# Patient Record
Sex: Female | Born: 1975 | Race: Black or African American | Hispanic: No | Marital: Married | Smoking: Never smoker
Health system: Southern US, Community
[De-identification: ages and names within clinical notes are randomized; demographics above are authoritative.]

---

## 2015-05-21 ENCOUNTER — Encounter: Payer: Self-pay | Admitting: Emergency Medicine

## 2015-05-21 ENCOUNTER — Emergency Department: Payer: Self-pay

## 2015-05-21 ENCOUNTER — Emergency Department
Admission: EM | Admit: 2015-05-21 | Discharge: 2015-05-21 | Disposition: A | Discharge status: Home / Self Care | Payer: Self-pay | Attending: Emergency Medicine | Admitting: Emergency Medicine

## 2015-05-21 DIAGNOSIS — R0602 Shortness of breath: Secondary | ICD-10-CM | POA: Insufficient documentation

## 2015-05-21 DIAGNOSIS — R002 Palpitations: Secondary | ICD-10-CM | POA: Insufficient documentation

## 2015-05-21 DIAGNOSIS — R0789 Other chest pain: Secondary | ICD-10-CM | POA: Insufficient documentation

## 2015-05-21 LAB — COMPREHENSIVE METABOLIC PANEL
ALBUMIN: 4.3 g/dL (ref 3.5–5.0)
ALK PHOS: 64 U/L (ref 38–126)
ALT: 15 U/L (ref 14–54)
AST: 23 U/L (ref 15–41)
Anion gap: 7 (ref 5–15)
BUN: 9 mg/dL (ref 6–20)
CALCIUM: 8.9 mg/dL (ref 8.9–10.3)
CO2: 24 mmol/L (ref 22–32)
CREATININE: 0.66 mg/dL (ref 0.44–1.00)
Chloride: 106 mmol/L (ref 101–111)
GFR calc non Af Amer: >60 mL/min (ref >60)
GLUCOSE: 112 mg/dL — AB (ref 65–99)
Potassium: 3.5 mmol/L (ref 3.5–5.1)
SODIUM: 137 mmol/L (ref 135–145)
Total Bilirubin: 0.3 mg/dL (ref 0.3–1.2)
Total Protein: 7.5 g/dL (ref 6.5–8.1)

## 2015-05-21 LAB — TROPONIN I: Troponin I: <0.03 ng/mL (ref <0.031)

## 2015-05-21 LAB — CBC WITH DIFFERENTIAL/PLATELET
BASOS PCT: 1 %
Basophils Absolute: 0 10*3/uL (ref 0–0.1)
Eosinophils Absolute: 0.1 10*3/uL (ref 0–0.7)
Eosinophils Relative: 1 %
HEMATOCRIT: 39.3 % (ref 35.0–47.0)
HEMOGLOBIN: 12.9 g/dL (ref 12.0–16.0)
Lymphocytes Relative: 23 %
Lymphs Abs: 1.3 10*3/uL (ref 1.0–3.6)
MCH: 26.9 pg (ref 26.0–34.0)
MCHC: 32.9 g/dL (ref 32.0–36.0)
MCV: 81.8 fL (ref 80.0–100.0)
MONOS PCT: 8 %
Monocytes Absolute: 0.4 10*3/uL (ref 0.2–0.9)
NEUTROS ABS: 3.9 10*3/uL (ref 1.4–6.5)
NEUTROS PCT: 67 %
Platelets: 283 10*3/uL (ref 150–440)
RBC: 4.8 MIL/uL (ref 3.80–5.20)
RDW: 13.7 % (ref 11.5–14.5)
WBC: 5.8 10*3/uL (ref 3.6–11.0)

## 2015-05-21 LAB — FIBRIN DERIVATIVES D-DIMER (ARMC ONLY): Fibrin derivatives D-dimer (ARMC): 207 (ref 0–499)

## 2015-05-21 NOTE — Discharge Instructions (Signed)
You have been seen in the Emergency Department (ED) today for chest pain.  As we have discussed todays test results are normal, and we suspect that you are having her symptoms as a result of the extra stress and strain your body has been under.  We provided the name and number of a clinic with whom he can follow up to establish a primary care doctor.  If you are not doing so already, please also take a daily baby aspirin (81 mg), at least until you follow up with your doctor.  Return to the Emergency Department (ED) if you experience any further chest pain/pressure/tightness, difficulty breathing, or sudden sweating, or other symptoms that concern you.   Chest Pain (Nonspecific) It is often hard to give a specific diagnosis for the cause of chest pain. There is always a chance that your pain could be related to something serious, such as a heart attack or a blood clot in the lungs. You need to follow up with your health care provider for further evaluation. CAUSES   Heartburn.  Pneumonia or bronchitis.  Anxiety or stress.  Inflammation around your heart (pericarditis) or lung (pleuritis or pleurisy).  A blood clot in the lung.  A collapsed lung (pneumothorax). It can develop suddenly on its own (spontaneous pneumothorax) or from trauma to the chest.  Shingles infection (herpes zoster virus). The chest wall is composed of bones, muscles, and cartilage. Any of these can be the source of the pain.  The bones can be bruised by injury.  The muscles or cartilage can be strained by coughing or overwork.  The cartilage can be affected by inflammation and become sore (costochondritis). DIAGNOSIS  Lab tests or other studies may be needed to find the cause of your pain. Your health care provider may have you take a test called an ambulatory electrocardiogram (ECG). An ECG records your heartbeat patterns over a 24-hour period. You may also have other tests, such as:  Transthoracic  echocardiogram (TTE). During echocardiography, sound waves are used to evaluate how blood flows through your heart.  Transesophageal echocardiogram (TEE).  Cardiac monitoring. This allows your health care provider to monitor your heart rate and rhythm in real time.  Holter monitor. This is a portable device that records your heartbeat and can help diagnose heart arrhythmias. It allows your health care provider to track your heart activity for several days, if needed.  Stress tests by exercise or by giving medicine that makes the heart beat faster. TREATMENT   Treatment depends on what may be causing your chest pain. Treatment may include:  Acid blockers for heartburn.  Anti-inflammatory medicine.  Pain medicine for inflammatory conditions.  Antibiotics if an infection is present.  You may be advised to change lifestyle habits. This includes stopping smoking and avoiding alcohol, caffeine, and chocolate.  You may be advised to keep your head raised (elevated) when sleeping. This reduces the chance of acid going backward from your stomach into your esophagus. Most of the time, nonspecific chest pain will improve within 2-3 days with rest and mild pain medicine.  HOME CARE INSTRUCTIONS   If antibiotics were prescribed, take them as directed. Finish them even if you start to feel better.  For the next few days, avoid physical activities that bring on chest pain. Continue physical activities as directed.  Do not use any tobacco products, including cigarettes, chewing tobacco, or electronic cigarettes.  Avoid drinking alcohol.  Only take medicine as directed by your health care provider.  Follow your health care provider's suggestions for further testing if your chest pain does not go away.  Keep any follow-up appointments you made. If you do not go to an appointment, you could develop lasting (chronic) problems with pain. If there is any problem keeping an appointment, call to  reschedule. SEEK MEDICAL CARE IF:   Your chest pain does not go away, even after treatment.  You have a rash with blisters on your chest.  You have a fever. SEEK IMMEDIATE MEDICAL CARE IF:   You have increased chest pain or pain that spreads to your arm, neck, jaw, back, or abdomen.  You have shortness of breath.  You have an increasing cough, or you cough up blood.  You have severe back or abdominal pain.  You feel nauseous or vomit.  You have severe weakness.  You faint.  You have chills. This is an emergency. Do not wait to see if the pain will go away. Get medical help at once. Call your local emergency services (911 in [place] Do not drive yourself to the hospital. MAKE SURE YOU:   Understand these instructions.  Will watch your condition.  Will get help right away if you are not doing well or get worse. Document Released: 03/25/2005 Document Revised: 06/20/2013 Document Reviewed: 01/19/2008 [facility] Patient Information 2015 [place], [place]. This information is not intended to replace advice given to you by your health care provider. Make sure you discuss any questions you have with your health care provider.    Palpitations A palpitation is the feeling that your heartbeat is irregular or is faster than normal. It may feel like your heart is fluttering or skipping a beat. Palpitations are usually not a serious problem. However, in some cases, you may need further medical evaluation. CAUSES  Palpitations can be caused by:  Smoking.  Caffeine or other stimulants, such as diet pills or energy drinks.  Alcohol.  Stress and anxiety.  Strenuous physical activity.  Fatigue.  Certain medicines.  Heart disease, especially if you have a history of irregular heart rhythms (arrhythmias), such as atrial fibrillation, atrial flutter, or supraventricular tachycardia.  An improperly working pacemaker or defibrillator. DIAGNOSIS  To find the cause of your palpitations,  your health care provider will take your medical history and perform a physical exam. Your health care provider may also have you take a test called an ambulatory electrocardiogram (ECG). An ECG records your heartbeat patterns over a 24-hour period. You may also have other tests, such as:  Transthoracic echocardiogram (TTE). During echocardiography, sound waves are used to evaluate how blood flows through your heart.  Transesophageal echocardiogram (TEE).  Cardiac monitoring. This allows your health care provider to monitor your heart rate and rhythm in real time.  Holter monitor. This is a portable device that records your heartbeat and can help diagnose heart arrhythmias. It allows your health care provider to track your heart activity for several days, if needed.  Stress tests by exercise or by giving medicine that makes the heart beat faster. TREATMENT  Treatment of palpitations depends on the cause of your symptoms and can vary greatly. Most cases of palpitations do not require any treatment other than time, relaxation, and monitoring your symptoms. Other causes, such as atrial fibrillation, atrial flutter, or supraventricular tachycardia, usually require further treatment. HOME CARE INSTRUCTIONS   Avoid:  Caffeinated coffee, tea, soft drinks, diet pills, and energy drinks.  Chocolate.  Alcohol.  Stop smoking if you smoke.  Reduce your stress and anxiety.  Things that can help you relax include:  A method of controlling things in your body, such as your heartbeats, with your mind (biofeedback).  Yoga.  Meditation.  Physical activity such as swimming, jogging, or walking.  Get plenty of rest and sleep. SEEK MEDICAL CARE IF:   You continue to have a fast or irregular heartbeat beyond 24 hours.  Your palpitations occur more often. SEEK IMMEDIATE MEDICAL CARE IF:  You have chest pain or shortness of breath.  You have a severe headache.  You feel dizzy or you  faint. MAKE SURE YOU:  Understand these instructions.  Will watch your condition.  Will get help right away if you are not doing well or get worse.   This information is not intended to replace advice given to you by your health care provider. Make sure you discuss any questions you have with your health care provider.   Document Released: 06/12/2000 Document Revised: 06/20/2013 Document Reviewed: 08/14/2011 Elsevier Interactive Patient Education [employer]2016 [employer] Inc.

## 2015-05-21 NOTE — ED Notes (Signed)
PT to ER with c/o palpitations, SHOB and chest heaviness.  Pt states has been fatigued lately due to family illness

## 2015-05-21 NOTE — ED Provider Notes (Signed)
[facility] Emergency Department Provider Note  ____________________________________________  Time seen: Approximately 6:34 PM  I have reviewed the triage vital signs and the nursing notes.   HISTORY  Chief Complaint Chest Pain and Palpitations    HPI Baldwin CrownBridget Delrossi is a 39 y.o. female who reports no past medical history and who does not smoke presents with palpitations and chest discomfort earlier today.  She states that the symptoms were relatively acute in onset and brief.  She reports some shortness of breath and the feeling of chest heaviness at the time.  It is completely resolved.  She states that she is dealing with a difficult situation with a family member who is in the hospital in another town and she has been very tired, not sleeping or eating well, going back and forth to check on the family member and deal with her own life as well.  She has had no infectious symptoms and denies fever/chills, abdominal pain, nausea/vomiting, diaphoresis, dysuria.  She has never had this kind of symptom before and describes it as mild but it was concerning to her at this time.  Though she has been traveling recently to see her relative, the driver was relatively short (within an hour) and she is active, ambulatory, not recently immobile, no history of cancer, no history of recent surgery, and does not take any exogenous estrogen.  She is also not had any swelling or pain in either of her legs.   History reviewed. No pertinent past medical history.  There are no active problems to display for this patient.   History reviewed. No pertinent past surgical history.  No current outpatient prescriptions on file.  Allergies Review of patient's allergies indicates no known allergies.  History reviewed. No pertinent family history.  Social History Social History  Substance Use Topics  . Smoking status: Never Smoker   . Smokeless tobacco: None  . Alcohol Use: No     Review of Systems Constitutional: No fever/chills Eyes: No visual changes. ENT: No sore throat. Cardiovascular: Brief feeling of palpitations and chest heaviness Respiratory: Mild shortness of breath associated with the palpitations Gastrointestinal: No abdominal pain.  No nausea, no vomiting.  No diarrhea.  No constipation. Genitourinary: Negative for dysuria. Musculoskeletal: Negative for back pain. Skin: Negative for rash. Neurological: Negative for headaches, focal weakness or numbness.  10-point ROS otherwise negative.  ____________________________________________   PHYSICAL EXAM:  VITAL SIGNS: ED Triage Vitals  Enc Vitals Group     BP 05/21/15 1727 138/76 mmHg     Pulse Rate 05/21/15 1727 108     Resp 05/21/15 1727 18     Temp 05/21/15 1727 98.2 F (36.8 C)     Temp src --      SpO2 05/21/15 1727 100 %     Weight 05/21/15 1727 145 lb (65.772 kg)     Height 05/21/15 1727 5\' 7"  (1.702 m)     Head Cir --      Peak Flow --      Pain Score 05/21/15 1728 0     Pain Loc --      Pain Edu? --      Excl. in GC? --     Constitutional: Alert and oriented. Well appearing and in no acute distress. Eyes: Conjunctivae are normal. PERRL. EOMI. Head: Atraumatic. Nose: No congestion/rhinnorhea. Mouth/Throat: Mucous membranes are moist.  Oropharynx non-erythematous. Neck: No stridor.   Cardiovascular: Normal rate, regular rhythm. Grossly normal heart sounds.  Good peripheral circulation.  When the patient first checked and she had mild tachycardia but it has completely resolved during my evaluation. Respiratory: Normal respiratory effort.  No retractions. Lungs CTAB. Gastrointestinal: Soft and nontender. No distention. No abdominal bruits. No CVA tenderness. Musculoskeletal: No lower extremity tenderness nor edema.  No joint effusions. Neurologic:  Normal speech and language. No gross focal neurologic deficits are appreciated.  Skin:  Skin is warm, dry and intact. No rash  noted. Psychiatric: Mood and affect are normal. Speech and behavior are normal.  ____________________________________________   LABS (all labs ordered are listed, but only abnormal results are displayed)  Labs Reviewed  COMPREHENSIVE METABOLIC PANEL - Abnormal; Notable for the following:    Glucose, Bld 112 (*)    All other components within normal limits  CBC WITH DIFFERENTIAL/PLATELET  TROPONIN I  FIBRIN DERIVATIVES D-DIMER ([facility] ONLY)   ____________________________________________  EKG  ED ECG REPORT I, Rubylee Zamarripa, the attending physician, personally viewed and interpreted this ECG.  Date: 05/21/2015 EKG Time: 18:42 Rate: 111 Rhythm: Sinus tachycardia QRS Axis: normal Intervals: normal ST/T Wave abnormalities: normal Conduction Disutrbances: none Narrative Interpretation: unremarkable  ____________________________________________  RADIOLOGY   Dg Chest 2 View  05/21/2015  CLINICAL DATA:  Shortness of breath and palpitations. Elevated blood pressure since 3:30 p.m. EXAM: CHEST  2 VIEW COMPARISON:  None. FINDINGS: The heart size and mediastinal contours are within normal limits. Both lungs are clear. The visualized skeletal structures are unremarkable. IMPRESSION: No active cardiopulmonary disease. Electronically Signed   By: Burman Nieves M.D.   On: 05/21/2015 19:10    ____________________________________________   PROCEDURES  Procedure(s) performed: None  Critical Care performed: No ____________________________________________   INITIAL IMPRESSION / ASSESSMENT AND PLAN / ED COURSE  Pertinent labs & imaging results that were available during my care of the patient were reviewed by me and considered in my medical decision making (see chart for details).  The patient is very well-appearing and in no acute distress with completely resolved symptoms.  The PERC rule does not apply if we go based on the initial heart rate of 108 recorded in triage.Marland Kitchen   However, she is at low risk of a pulmonary embolism.  I discussed extensively with the patient and her husband the possibility of a brief arrhythmia (such as SVT, although I did not name this specifically), or the possibility of a pulmonary embolism.  I discussed the pros and cons of d-dimer testing, most notably that if the d-dimer is positive for any reason that we must proceed with a CTA of the chest.  The patient said that she does not want a CT regardless of whether or not the d-dimer is elevated, and I explained that a missed pulmonary embolism can be potentially life-threatening, but she thinks that this is such a low possibility that she does not want the test, and I agree with this assessment so I will not further tried to encourage her to do the testing.  We will check basic labs, chest x-ray, and EKG, and reassess.  ----------------------------------------- 6:47 PM on 05/21/2015 -----------------------------------------  The patient's EKG shows that even at rest her heart rate is still 111.  I do not have a better alternative diagnosis for her symptoms and her tachycardia, so I will received with a d-dimer to see if we can essentially rule out a pulmonary embolism.  If the d-dimer is elevated I will again discussed with the patient the benefits of a CT scan of the chest and the risk of a missed  PE diagnosis.  ----------------------------------------- 7:43 PM on 05/21/2015 -----------------------------------------  Fortunately the patient's d-dimer was within normal limits effectively excluding the possibility of a thromboembolism.  The patient feels well at this time and thinks that she is just tired and worn out for think she has been going through.  She is comfortable with the plan to go home and follow-up as an outpatient and I believe that this to be appropriate as well.  I usual and customary return precautions. ____________________________________________  FINAL CLINICAL IMPRESSION(S)  / ED DIAGNOSES  Final diagnoses:  Heart palpitations  Chest discomfort      NEW MEDICATIONS STARTED DURING THIS VISIT:  New Prescriptions   No medications on file     Loleta Rose, MD 05/21/15 1944

## 2015-06-06 ENCOUNTER — Encounter: Payer: Self-pay | Admitting: *Deleted

## 2015-06-06 ENCOUNTER — Emergency Department: Payer: Self-pay

## 2015-06-06 ENCOUNTER — Emergency Department
Admission: EM | Admit: 2015-06-06 | Discharge: 2015-06-06 | Disposition: A | Discharge status: Home / Self Care | Payer: Self-pay | Attending: Emergency Medicine | Admitting: Emergency Medicine

## 2015-06-06 DIAGNOSIS — R0789 Other chest pain: Secondary | ICD-10-CM | POA: Insufficient documentation

## 2015-06-06 DIAGNOSIS — R000 Tachycardia, unspecified: Secondary | ICD-10-CM | POA: Insufficient documentation

## 2015-06-06 DIAGNOSIS — J069 Acute upper respiratory infection, unspecified: Secondary | ICD-10-CM | POA: Insufficient documentation

## 2015-06-06 DIAGNOSIS — R002 Palpitations: Secondary | ICD-10-CM | POA: Insufficient documentation

## 2015-06-06 LAB — BASIC METABOLIC PANEL
Anion gap: 9 (ref 5–15)
CHLORIDE: 107 mmol/L (ref 101–111)
CO2: 25 mmol/L (ref 22–32)
Calcium: 9.8 mg/dL (ref 8.9–10.3)
Creatinine, Ser: 0.75 mg/dL (ref 0.44–1.00)
GFR calc Af Amer: >60 mL/min (ref >60)
Glucose, Bld: 130 mg/dL — ABNORMAL HIGH (ref 65–99)
Potassium: 3.5 mmol/L (ref 3.5–5.1)
SODIUM: 141 mmol/L (ref 135–145)

## 2015-06-06 LAB — CBC
HEMATOCRIT: 42.6 % (ref 35.0–47.0)
HEMOGLOBIN: 14.1 g/dL (ref 12.0–16.0)
MCH: 26.9 pg (ref 26.0–34.0)
MCHC: 33 g/dL (ref 32.0–36.0)
MCV: 81.8 fL (ref 80.0–100.0)
Platelets: 324 10*3/uL (ref 150–440)
RBC: 5.22 MIL/uL — ABNORMAL HIGH (ref 3.80–5.20)
RDW: 13.4 % (ref 11.5–14.5)
WBC: 5 10*3/uL (ref 3.6–11.0)

## 2015-06-06 LAB — TROPONIN I

## 2015-06-06 MED ORDER — SODIUM CHLORIDE 0.9 % IV BOLUS (SEPSIS)
1000.0000 mL | Freq: Once | INTRAVENOUS | Status: AC
  Administered 2015-06-06: 1000 mL via INTRAVENOUS

## 2015-06-06 NOTE — ED Notes (Signed)
Pt reports [employer] [employer] religion. Refuses medications at this point.

## 2015-06-06 NOTE — ED Notes (Signed)
Pt states she feels like her heart is racing and a sinus congestion, states some occasional SOB

## 2015-06-06 NOTE — ED Notes (Signed)
Pt discharged home with family.  Discharged teaching done with patient.  Voiced understanding, teach back done.  No questions or concerns at this time.  Pt in NAD.  Items with pt upon discharge.  No items left in ED.

## 2015-06-06 NOTE — ED Notes (Signed)
Patient transported to X-ray 

## 2015-06-06 NOTE — ED Notes (Signed)
Pt ambulated to toilet in room.

## 2015-06-06 NOTE — ED Provider Notes (Signed)
[facility] Emergency Department Provider Note  ____________________________________________  Time seen: Approximately 450 PM  I have reviewed the triage vital signs and the nursing notes.   HISTORY  Chief Complaint Palpitations    HPI Jill Barber is a 39 y.o. female without any chronic medical condition who is presenting today with a racing heartbeat ever since waking up this morning. She says that she has had sinus congestion and shortness of breath over the past several days with pressure to her forehead as well as aching to her neck back and chest. She denies any chest pain at this time. She says she is able to take deep breaths without any pain. Says that she is also had a decreased appetite and has not been eating and drinking as normal over the past several days. She is taking an herbal supplement for her symptoms which contains belladonna. She is on her second box over the past several days. The first box at 60 tabs and she is taking up to 2 tabs every 2 hours. Denies any known sick contacts.   History reviewed. No pertinent past medical history.  There are no active problems to display for this patient.   History reviewed. No pertinent past surgical history.  No current outpatient prescriptions on file.  Allergies Review of patient's allergies indicates no known allergies.  History reviewed. No pertinent family history.  Social History Social History  Substance Use Topics  . Smoking status: Never Smoker   . Smokeless tobacco: None  . Alcohol Use: No    Review of Systems Constitutional: No fever/chills Eyes: No visual changes. ENT: No sore throat. Cardiovascular: Intermittent achiness which is diffuse to the chest and back. Respiratory: Intermittent shortness of breath. Not worsened with exertion.  Gastrointestinal: No abdominal pain.  No nausea, no vomiting.  No diarrhea.  No constipation. Genitourinary: Negative for  dysuria. Musculoskeletal: Negative for back pain. Skin: Negative for rash. Neurological: Negative for headaches, focal weakness or numbness.  10-point ROS otherwise negative.  ____________________________________________   PHYSICAL EXAM:  VITAL SIGNS: ED Triage Vitals  Enc Vitals Group     BP 06/06/15 1606 155/83 mmHg     Pulse Rate 06/06/15 1606 136     Resp 06/06/15 1606 18     Temp 06/06/15 1606 97.7 F (36.5 C)     Temp Source 06/06/15 1606 Oral     SpO2 --      Weight 06/06/15 1606 130 lb (58.968 kg)     Height 06/06/15 1606  (1.702 m)     Head Cir --      Peak Flow --      Pain Score 06/06/15 1605 5     Pain Loc --      Pain Edu? --      Excl. in GC? --     Constitutional: Alert and oriented. Well appearing and in no acute distress. Eyes: Conjunctivae are normal. PERRL. EOMI. Head: Atraumatic. Nose: No congestion/rhinnorhea. Mouth/Throat: Mucous membranes are moist.  Oropharynx non-erythematous. Neck: No stridor.   Cardiovascular: tachyCardiac, regular rhythm. Grossly normal heart sounds.  Good peripheral circulation. Respiratory: Normal respiratory effort.  No retractions. Lungs CTAB. Gastrointestinal: Soft and nontender. No distention. No abdominal bruits. No CVA tenderness. Musculoskeletal: No lower extremity tenderness nor edema.  No joint effusions. Neurologic:  Normal speech and language. No gross focal neurologic deficits are appreciated. No gait instability. Skin:  Skin is warm, dry and intact. No rash noted. Psychiatric: Mood and affect are normal.  Speech and behavior are normal.  ____________________________________________   LABS (all labs ordered are listed, but only abnormal results are displayed)  Labs Reviewed  BASIC METABOLIC PANEL - Abnormal; Notable for the following:    Glucose, Bld 130 (*)    BUN <5 (*)    All other components within normal limits  CBC - Abnormal; Notable for the following:    RBC 5.22 (*)    All other  components within normal limits  TROPONIN I   ____________________________________________  EKG  ED ECG REPORT I, Arelia LongestSchaevitz,  Leviticus Harton M, the attending physician, personally viewed and interpreted this ECG.   Date: 06/06/2015  EKG Time: 1611  Rate: 1:30  Rhythm: sinus tachycardia  Axis: Normal axis  Intervals:none  ST&T Change: No ST segment elevation or depression. No abnormal T-wave inversion.  ____________________________________________  RADIOLOGY  No active disease on chest x-ray. ____________________________________________   PROCEDURES   ____________________________________________   INITIAL IMPRESSION / ASSESSMENT AND PLAN / ED COURSE  Pertinent labs & imaging results that were available during my care of the patient were reviewed by me and considered in my medical decision making (see chart for details).  ----------------------------------------- 6:30 PM on 06/06/2015 -----------------------------------------  Patient's heart rate is now 91. She says she feels relieved after drinking several glasses of water. She denies any chest pain or shortness of breath at this time. I did discuss with her to stop taking the supplement with belladonna and that I believe this is likely the cause of her tachycardia. Otherwise, I do believe that she is a viral upper respiratory infection. The patient does not want to use any pharmaceuticals. We discussed teas, Nettie pot, a heating pad for muscle aches. The patient did not have any pleuritic chest pain.  I do not believe that the patient has pulmonary embolus. She did not have any pleuritic chest pain. She also has a direct reason to have tachycardia at this time which is her ingestion of the belladonna. She'll be given follow-up with primary care. She understands the plan and is willing to comply. ____________________________________________   FINAL CLINICAL IMPRESSION(S) / ED DIAGNOSES  Final diagnoses:  Chest pressure    palpitations. Upper respiratory infection    Myrna Blazeravid Matthew Carmela Piechowski, MD 06/06/15 [phone]

## 2015-12-21 IMAGING — CR DG CHEST 2V
2 series · 2 of 2 positions shown · non-contrast
Comparison: 05/21/2015

CLINICAL DATA: Chest pain and pressure. Tachycardia. Shortness of
breath.

EXAM:
CHEST  2 VIEW

[chest pa]
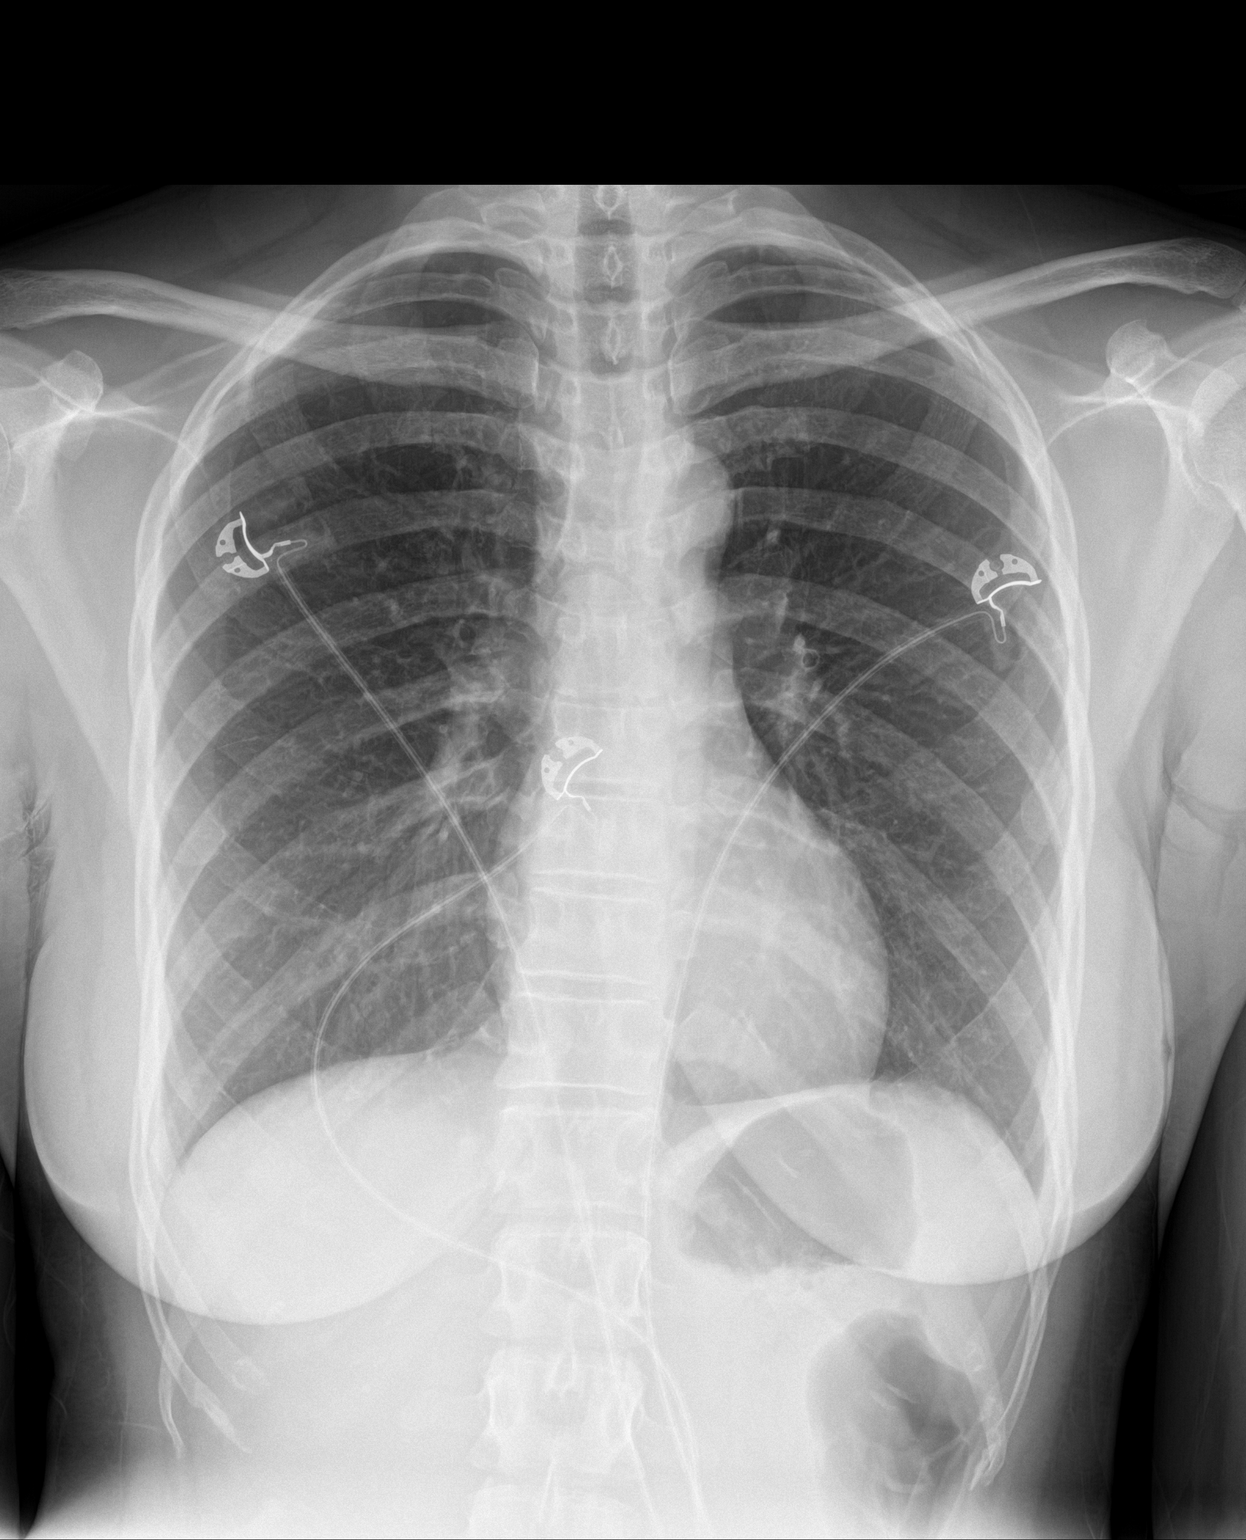

[chest lat]
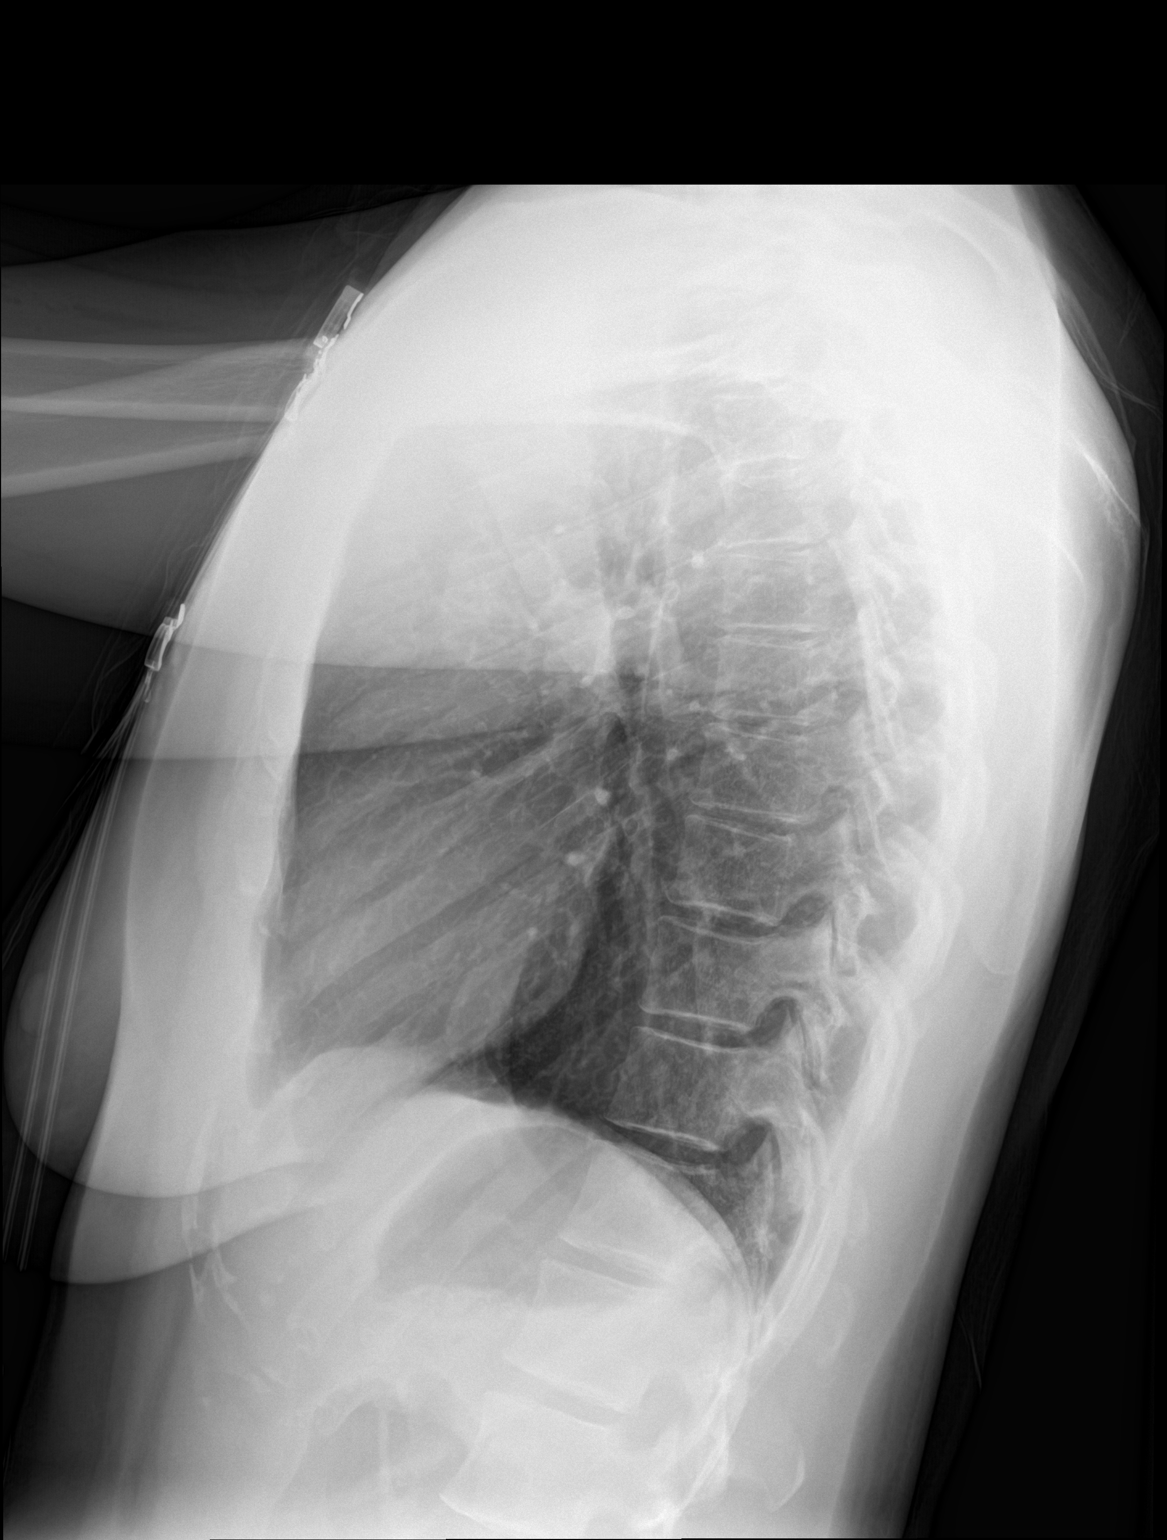

[2 of 2 positions shown; findings below may reference images not displayed]

FINDINGS: The heart size and mediastinal contours are within normal limits.
Both lungs are clear. No evidence of pneumothorax or pleural
effusion. The visualized skeletal structures are unremarkable.
IMPRESSION: Negative.  No active cardiopulmonary disease.
